# Patient Record
Sex: Female | Born: 1950 | Hispanic: No | Marital: Married | Smoking: Never smoker
Health system: Southern US, Community
[De-identification: ages and names within clinical notes are randomized; demographics above are authoritative.]

## PROBLEM LIST (undated history)

## (undated) DIAGNOSIS — I10 Essential (primary) hypertension: Secondary | ICD-10-CM

## (undated) DIAGNOSIS — E119 Type 2 diabetes mellitus without complications: Secondary | ICD-10-CM

## (undated) HISTORY — PX: ABDOMINAL HYSTERECTOMY: SHX81

---

## 2000-11-22 ENCOUNTER — Ambulatory Visit (HOSPITAL_BASED_OUTPATIENT_CLINIC_OR_DEPARTMENT_OTHER): Admission: RE | Admit: 2000-11-22 | Discharge: 2000-11-23 | Payer: Self-pay | Admitting: Orthopedic Surgery

## 2016-03-12 ENCOUNTER — Emergency Department (INDEPENDENT_AMBULATORY_CARE_PROVIDER_SITE_OTHER)
Admission: EM | Admit: 2016-03-12 | Discharge: 2016-03-12 | Disposition: A | Discharge status: Home / Self Care | Source: Home / Self Care | Attending: Family Medicine | Admitting: Family Medicine

## 2016-03-12 ENCOUNTER — Emergency Department (INDEPENDENT_AMBULATORY_CARE_PROVIDER_SITE_OTHER)

## 2016-03-12 ENCOUNTER — Encounter: Payer: Self-pay | Admitting: Emergency Medicine

## 2016-03-12 DIAGNOSIS — M25511 Pain in right shoulder: Secondary | ICD-10-CM | POA: Diagnosis not present

## 2016-03-12 DIAGNOSIS — G8929 Other chronic pain: Secondary | ICD-10-CM

## 2016-03-12 HISTORY — DX: Essential (primary) hypertension: I10

## 2016-03-12 HISTORY — DX: Type 2 diabetes mellitus without complications: E11.9

## 2016-03-12 MED ORDER — NAPROXEN 375 MG PO TABS: 375.0000 mg | ORAL_TABLET | Freq: Two times a day (BID) | ORAL | 0 refills | Status: AC

## 2016-03-12 NOTE — ED Triage Notes (Signed)
Pt c/o right shoulder pain x2 weeks states pain is intermittent but getting worse.

## 2016-03-12 NOTE — ED Provider Notes (Signed)
CSN: [id number]     Arrival date & time 03/12/16  1417 History   First MD Initiated Contact with Patient 03/12/16 1450     Chief Complaint  Patient presents with  . Shoulder Pain   (Consider location/radiation/quality/duration/timing/severity/associated sxs/prior Treatment) HPI Michele Moyer is a 66 y.o. female presenting to UC with c/o Right shoulder pain that has been constant for several months but worsening over the last 2 weeks.  She has seen her PCP for the pain and was sent to physical therapy but is still having pain and would like a second opinion.  She is not taking anything for the pain at this time including no acetaminophen or ibuprofen as she "does not want to have to worry about pain pills."   Denies specific known injury.  Pt is concerned about a rotator cuff injury. She has not been seen by an orthopedist yet.    Past Medical History:  Diagnosis Date  . Diabetes mellitus without complication (HCC)   . Hypertension    Past Surgical History:  Procedure Laterality Date  . ABDOMINAL HYSTERECTOMY     Family History  Problem Relation Age of Onset  . Cancer Mother    Social History  Substance Use Topics  . Smoking status: Never Smoker  . Smokeless tobacco: Never Used  . Alcohol use No   OB History    No data available     Review of Systems  Musculoskeletal: Positive for arthralgias, myalgias and neck pain. Negative for neck stiffness.       Right shoulder  Skin: Negative for color change and wound.  Neurological: Positive for weakness. Negative for numbness.    Allergies  Patient has no allergy information on record.  Home Medications   Prior to Admission medications   Medication Sig Start Date End Date Taking? Authorizing Provider  naproxen (NAPROSYN) 375 MG tablet Take 1 tablet (375 mg total) by mouth 2 (two) times daily. 03/12/16   Junius FinnerErin O'Malley, PA-C   Meds Ordered and Administered this Visit  Medications - No data to display  BP 155/94 (BP Location:  Right Arm)   Pulse 110   Temp 97.9 F (36.6 C) (Oral)   Wt 166 lb (75.3 kg)   SpO2 98%  No data found.   Physical Exam  Constitutional: She is oriented to person, place, and time. She appears well-developed and well-nourished.  HENT:  Head: Normocephalic and atraumatic.  Eyes: EOM are normal.  Neck: Normal range of motion.  Cardiovascular: Normal rate.   Pulses:      Radial pulses are 2+ on the right side.  Pulmonary/Chest: Effort normal.  Musculoskeletal: She exhibits tenderness. She exhibits no edema.  Right shoulder: no obvious deformity. Full abduction, slight decreased adduction compared to Left arm. 5/5 grip strength. No midline spinal tenderness.   Neurological: She is alert and oriented to person, place, and time.  Skin: Skin is warm and dry. Capillary refill takes less than 2 seconds.  Psychiatric: She has a normal mood and affect. Her behavior is normal.  Nursing note and vitals reviewed.   Urgent Care Course     Procedures (including critical care time)  Labs Review Labs Reviewed - No data to display  Imaging Review Dg Shoulder Right  Result Date: 03/12/2016 CLINICAL DATA:  Right shoulder pain EXAM: RIGHT SHOULDER - 2+ VIEW COMPARISON:  None. FINDINGS: Degenerative changes of the acromioclavicular joint are noted. No acute fracture or dislocation is seen. The underlying bony thorax is within normal limits.  IMPRESSION: No acute abnormality noted. Electronically Signed   By: Alcide Clever M.D.   On: 03/12/2016 15:17      MDM   1. 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 Recommend f/u with Sports Medicine for further evaluation as they may order an MRI or offer joint injections to help with pain.   Rx: Naproxen ROM exercise packet provided to pt.    Junius Finner, PA-C 03/12/16 1531

## 2018-03-06 IMAGING — DX DG SHOULDER 2+V*R*
3 series · 3 of 3 positions shown · non-contrast
Comparison: None.

CLINICAL DATA: Right shoulder pain

EXAM:
RIGHT SHOULDER - 2+ VIEW

[shoulder y view]
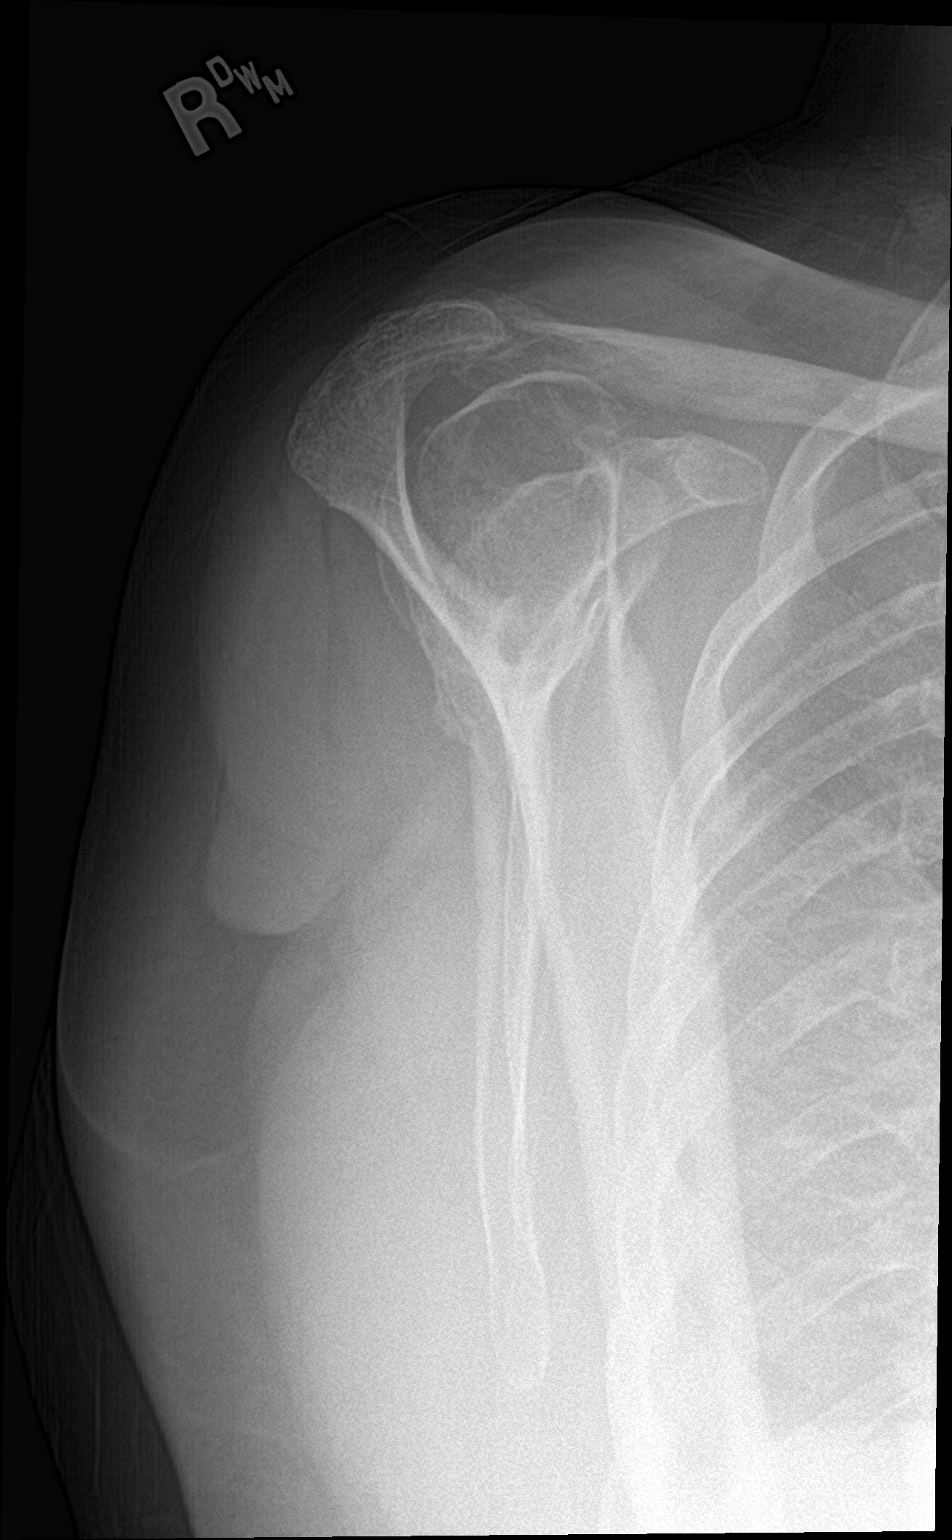

[shoulder axillary]
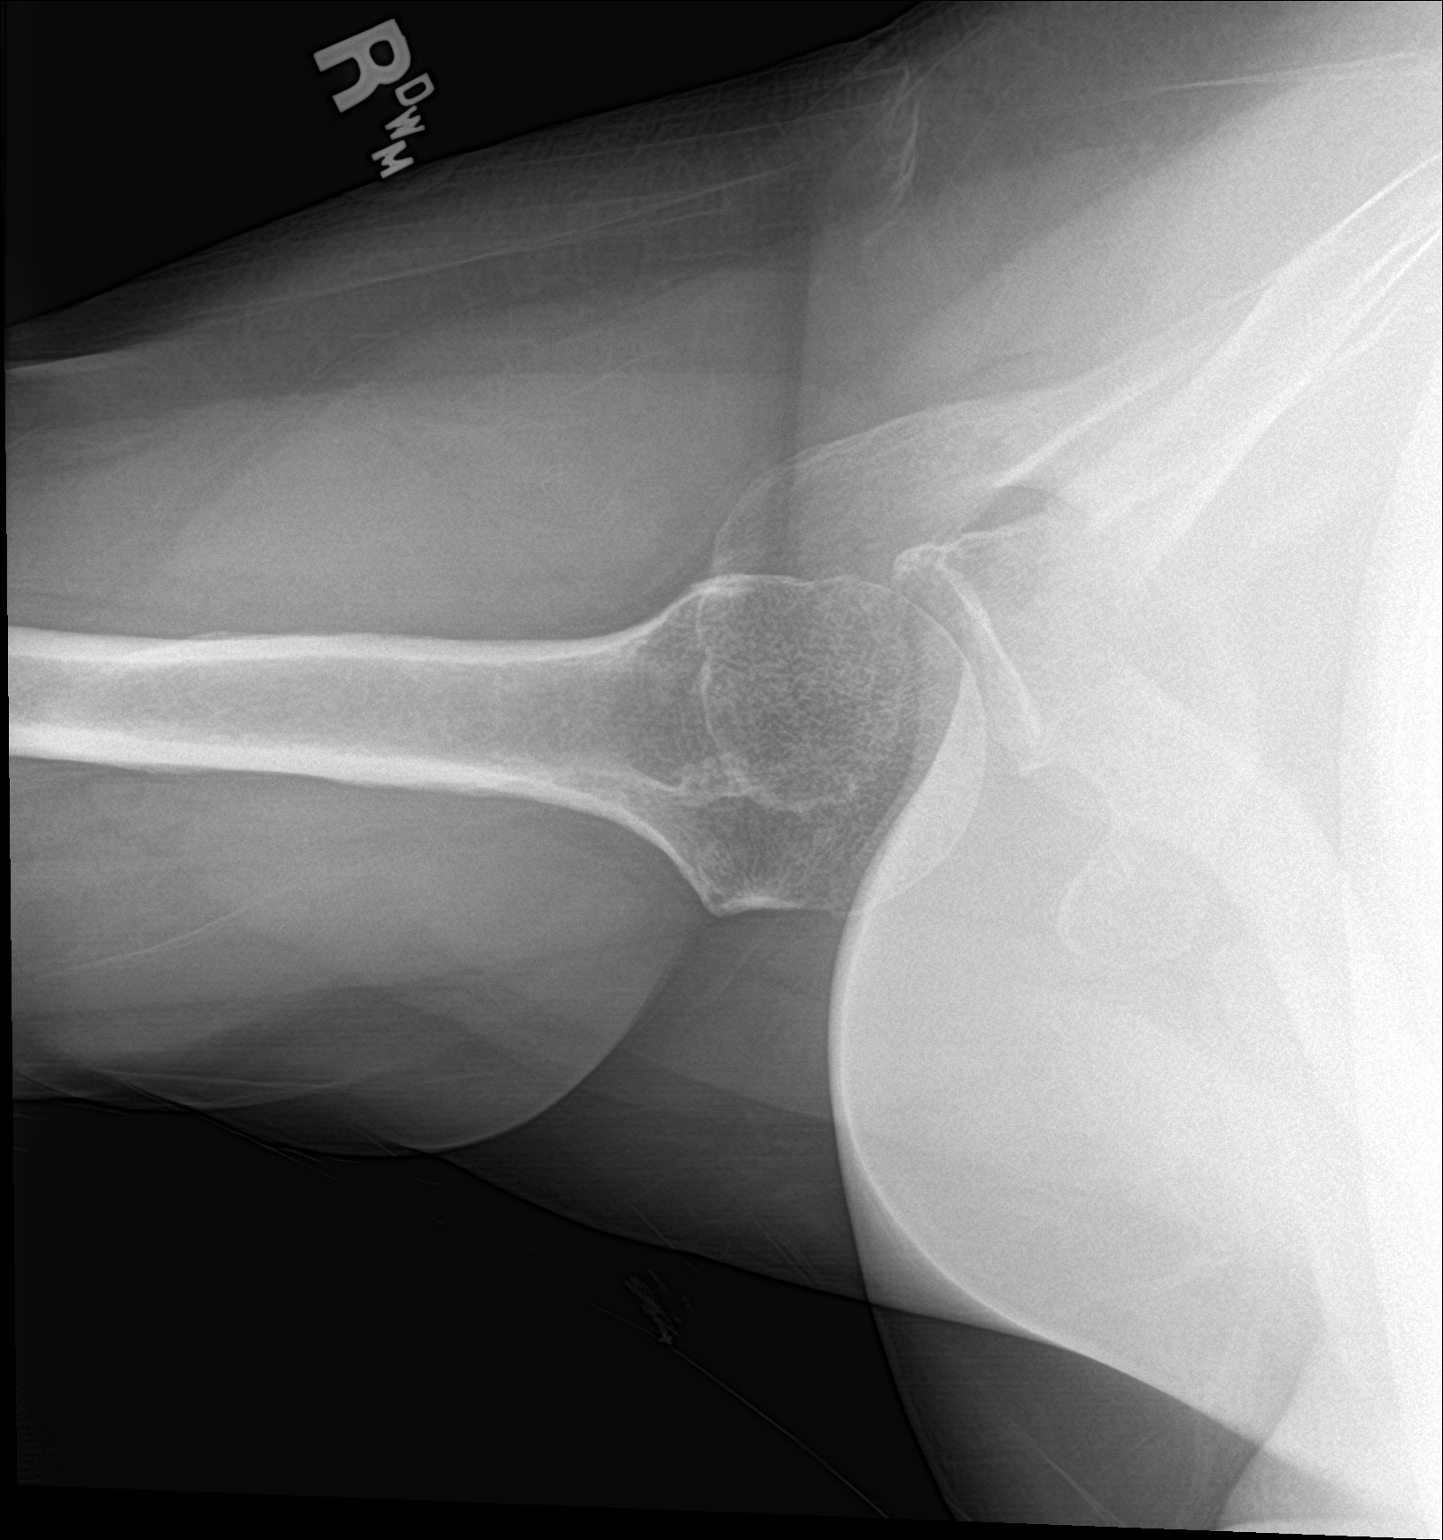

[shoulder grashey]
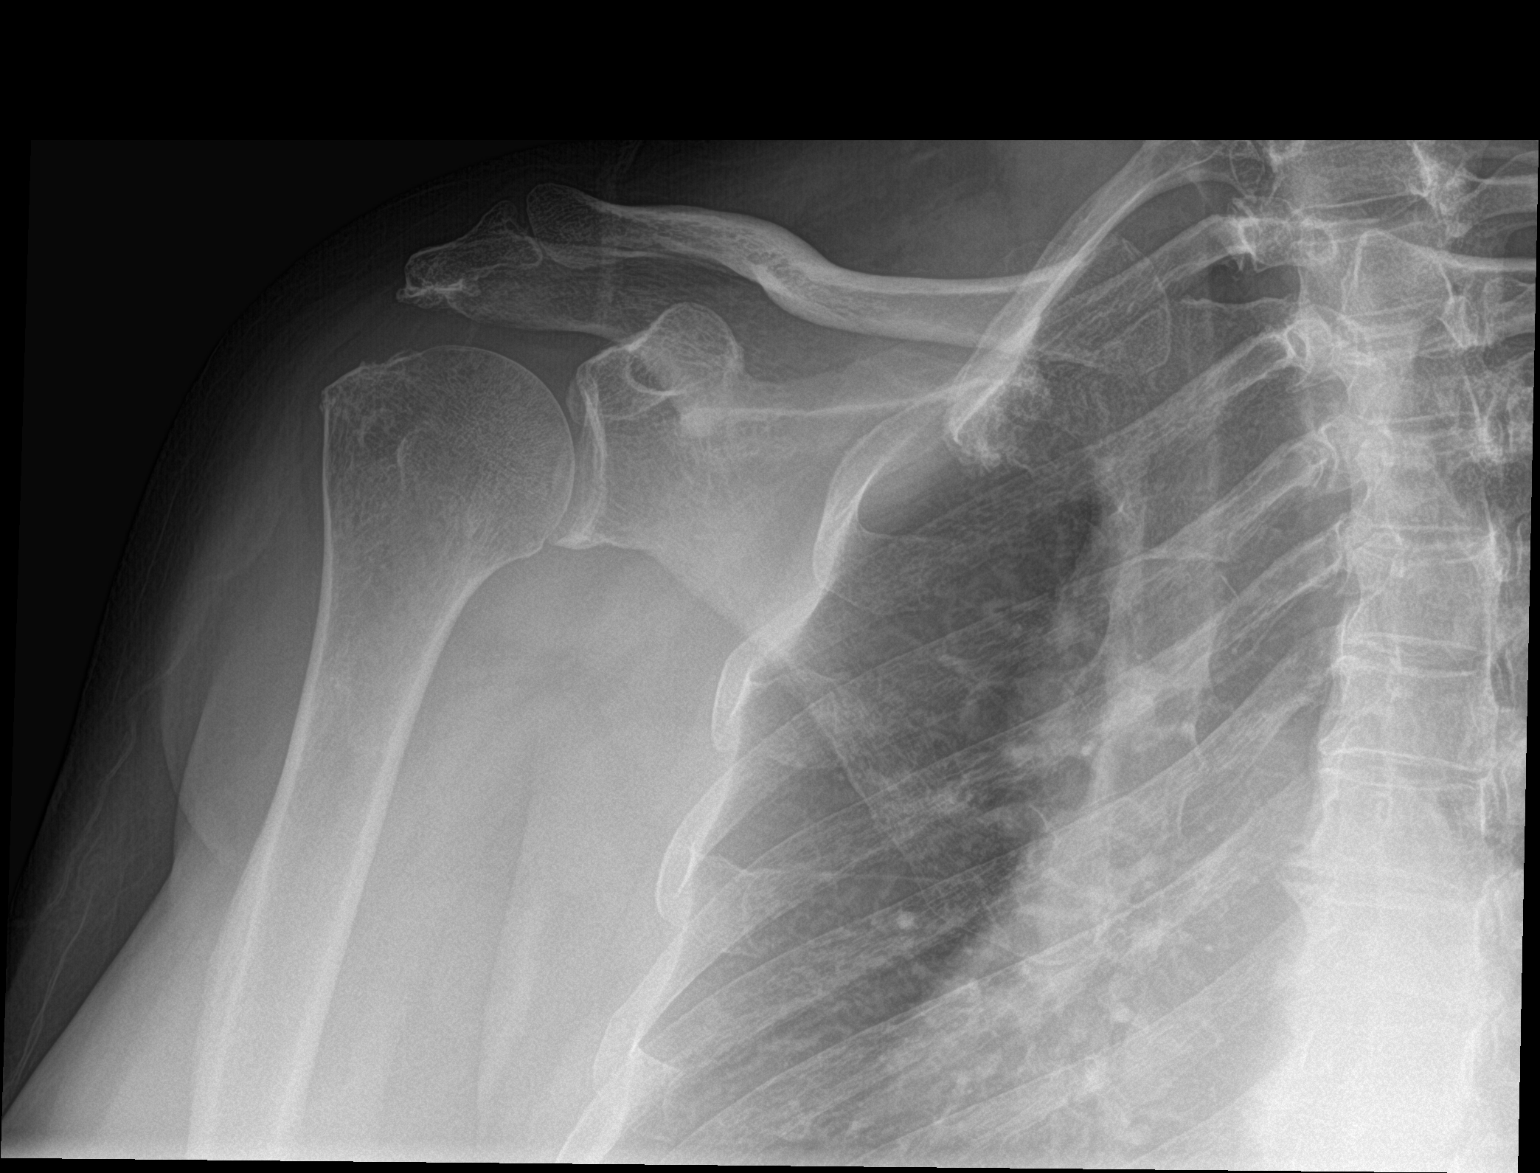

[3 of 3 positions shown; findings below may reference images not displayed]

FINDINGS: Degenerative changes of the acromioclavicular joint are noted. No
acute fracture or dislocation is seen. The underlying bony thorax is
within normal limits.
IMPRESSION: No acute abnormality noted.
